# Patient Record
Sex: Male | Born: 1990 | Race: White | Hispanic: No | Marital: Single | Smoking: Current every day smoker
Health system: Southern US, Community
[De-identification: ages and names within clinical notes are randomized; demographics above are authoritative.]

## PROBLEM LIST (undated history)

## (undated) DIAGNOSIS — F909 Attention-deficit hyperactivity disorder, unspecified type: Secondary | ICD-10-CM

## (undated) HISTORY — PX: TONSILLECTOMY: SUR1361

---

## 2015-11-03 ENCOUNTER — Encounter (HOSPITAL_COMMUNITY): Payer: Self-pay | Admitting: Emergency Medicine

## 2015-11-03 ENCOUNTER — Ambulatory Visit (HOSPITAL_COMMUNITY)
Admission: EM | Admit: 2015-11-03 | Discharge: 2015-11-03 | Disposition: A | Discharge status: Home / Self Care | Payer: BLUE CROSS/BLUE SHIELD | Attending: Internal Medicine | Admitting: Internal Medicine

## 2015-11-03 DIAGNOSIS — J069 Acute upper respiratory infection, unspecified: Secondary | ICD-10-CM

## 2015-11-03 DIAGNOSIS — J9801 Acute bronchospasm: Secondary | ICD-10-CM | POA: Diagnosis not present

## 2015-11-03 MED ORDER — PREDNISONE 20 MG PO TABS: ORAL_TABLET | ORAL | 0 refills | Status: AC

## 2015-11-03 MED ORDER — ALBUTEROL SULFATE HFA 108 (90 BASE) MCG/ACT IN AERS: 2.0000 | INHALATION_SPRAY | RESPIRATORY_TRACT | 0 refills | Status: AC | PRN

## 2015-11-03 MED ORDER — IPRATROPIUM-ALBUTEROL 0.5-2.5 (3) MG/3ML IN SOLN
3.0000 mL | Freq: Once | RESPIRATORY_TRACT | Status: AC
  Administered 2015-11-03: 3 mL via RESPIRATORY_TRACT

## 2015-11-03 MED ORDER — METHYLPREDNISOLONE ACETATE 40 MG/ML IJ SUSP
INTRAMUSCULAR | Status: AC
  Filled 2015-11-03: qty 1

## 2015-11-03 MED ORDER — DEXAMETHASONE SODIUM PHOSPHATE 10 MG/ML IJ SOLN
10.0000 mg | Freq: Once | INTRAMUSCULAR | Status: AC
  Administered 2015-11-03: 10 mg via INTRAMUSCULAR

## 2015-11-03 MED ORDER — ALBUTEROL SULFATE (2.5 MG/3ML) 0.083% IN NEBU
2.5000 mg | INHALATION_SOLUTION | Freq: Once | RESPIRATORY_TRACT | Status: AC
  Administered 2015-11-03: 2.5 mg via RESPIRATORY_TRACT

## 2015-11-03 MED ORDER — IPRATROPIUM-ALBUTEROL 0.5-2.5 (3) MG/3ML IN SOLN
RESPIRATORY_TRACT | Status: AC
  Filled 2015-11-03: qty 3

## 2015-11-03 MED ORDER — METHYLPREDNISOLONE ACETATE 40 MG/ML IJ SUSP
40.0000 mg | Freq: Once | INTRAMUSCULAR | Status: AC
  Administered 2015-11-03: 40 mg via INTRAMUSCULAR

## 2015-11-03 MED ORDER — DEXAMETHASONE SODIUM PHOSPHATE 10 MG/ML IJ SOLN
INTRAMUSCULAR | Status: AC
  Filled 2015-11-03: qty 1

## 2015-11-03 MED ORDER — ALBUTEROL SULFATE (2.5 MG/3ML) 0.083% IN NEBU
INHALATION_SOLUTION | RESPIRATORY_TRACT | Status: AC
  Filled 2015-11-03: qty 3

## 2015-11-03 NOTE — Discharge Instructions (Signed)
For nasal and head congestion may take Sudafed PE 10 mg every 4 hours as needed. °Saline nasal spray used frequently. °For drainage may use Allegra, Claritin or Zyrtec. If you need stronger medicine to stop drainage may take Chlor-Trimeton 2-4 mg every 4 hours. This may cause drowsiness. °Ibuprofen 600 mg every 6 hours as needed for pain, discomfort or fever. °Drink plenty of fluids and stay well-hydrated. °

## 2015-11-03 NOTE — ED Provider Notes (Signed)
CSN: [id number]     Arrival date & time 11/03/15  1606 History   First MD Initiated Contact with Patient 11/03/15 1849     Chief Complaint  Patient presents with  . URI   (Consider location/radiation/quality/duration/timing/severity/associated sxs/prior Treatment) 25 year old male complaining of chest congestion for 5-6 days. And associated with cough sometimes producing mucus, PND, earache and sore throat. Denies fever. States he smokes one cigar a day. Last evening he took some NyQuil with minimal relief.      History reviewed. No pertinent past medical history. History reviewed. No pertinent surgical history. No family history on file. Social History  Substance Use Topics  . Smoking status: Current Every Day Smoker    Types: Cigars  . Smokeless tobacco: Never Used     Comment: 1 cigar per day  . Alcohol use Yes    Review of Systems  Constitutional: Positive for activity change. Negative for diaphoresis, fatigue and fever.  HENT: Positive for congestion, postnasal drip, rhinorrhea and sore throat. Negative for ear pain, facial swelling and trouble swallowing.   Eyes: Negative for pain, discharge and redness.  Respiratory: Positive for cough, shortness of breath and wheezing. Negative for chest tightness.   Cardiovascular: Negative.   Gastrointestinal: Negative.   Musculoskeletal: Negative.  Negative for neck pain and neck stiffness.  Neurological: Negative.   All other systems reviewed and are negative.   Allergies  Review of patient's allergies indicates no known allergies.  Home Medications   Prior to Admission medications   Medication Sig Start Date End Date Taking? Authorizing Provider  albuterol (PROVENTIL HFA;VENTOLIN HFA) 108 (90 Base) MCG/ACT inhaler Inhale 2 puffs into the lungs every 4 (four) hours as needed for wheezing or shortness of breath. 11/03/15   Hayden Rasmussenavid Vianey Caniglia, NP  predniSONE (DELTASONE) 20 MG tablet Take 3 tabs po on first day, 2 tabs second day, 2 tabs  third day, 1 tab fourth day, 1 tab 5th day. Take with food. 11/03/15   Hayden Rasmussenavid England Greb, NP   Meds Ordered and Administered this Visit   Medications  dexamethasone (DECADRON) injection 10 mg (10 mg Intramuscular Given 11/03/15 1925)  methylPREDNISolone acetate (DEPO-MEDROL) injection 40 mg (40 mg Intramuscular Given 11/03/15 1925)  ipratropium-albuterol (DUONEB) 0.5-2.5 (3) MG/3ML nebulizer solution 3 mL (3 mLs Nebulization Given 11/03/15 1925)  albuterol (PROVENTIL) (2.5 MG/3ML) 0.083% nebulizer solution 2.5 mg (2.5 mg Nebulization Given 11/03/15 1925)    BP 126/71 (BP Location: Left Arm)   Pulse 68   Temp 98.7 F (37.1 C) (Oral)   Resp 16   Ht 5\' 8"  (1.727 m)   Wt 145 lb (65.8 kg)   SpO2 100%   BMI 22.05 kg/m  No data found.   Physical Exam  Constitutional: He is oriented to person, place, and time. He appears well-developed and well-nourished. No distress.  HENT:  Head: Normocephalic and atraumatic.  Right Ear: External ear normal.  Left Ear: External ear normal.  Mouth/Throat: No oropharyngeal exudate.  Bilateral TMs are retracted. Right greater than left. Right TM with minor erythema.  Oropharynx with minor erythema and cobblestoning.  Eyes: EOM are normal. Pupils are equal, round, and reactive to light.  Neck: Normal range of motion. Neck supple.  Cardiovascular: Normal rate, regular rhythm and normal heart sounds.   Pulmonary/Chest: No respiratory distress.  Loud coarse wheezing and rhonchi bilaterally. Prolonged expiratory phase.  Musculoskeletal: Normal range of motion. He exhibits no edema.  Lymphadenopathy:    He has no cervical adenopathy.  Neurological: He is alert  and oriented to person, place, and time.  Skin: Skin is warm and dry. No rash noted.  Psychiatric: He has a normal mood and affect.  Nursing note and vitals reviewed.   Urgent Care Course   Clinical Course    Procedures (including critical care time)  Labs Review Labs Reviewed - No data to  display  Imaging Review No results found.   Visual Acuity Review  Right Eye Distance:   Left Eye Distance:   Bilateral Distance:    Right Eye Near:   Left Eye Near:    Bilateral Near:         MDM   1. URI (upper respiratory infection)   2. Bronchospasm   After the DuoNeb patient states he felt better and was breathing better. Auscultation reveals near complete absence of coarseness and rhonchi. Distant faint distant rare wheeze. Good air movement and normal inspiratory to 4 days his. For nasal and head congestion may take Sudafed PE 10 mg every 4 hours as needed. Saline nasal spray used frequently. For drainage may use Allegra, Claritin or Zyrtec. If you need stronger medicine to stop drainage may take Chlor-Trimeton 2-4 mg every 4 hours. This may cause drowsiness. Ibuprofen 600 mg every 6 hours as needed for pain, discomfort or fever. Drink plenty of fluids and stay well-hydrated. Meds ordered this encounter  Medications  . dexamethasone (DECADRON) injection 10 mg  . methylPREDNISolone acetate (DEPO-MEDROL) injection 40 mg  . ipratropium-albuterol (DUONEB) 0.5-2.5 (3) MG/3ML nebulizer solution 3 mL  . albuterol (PROVENTIL) (2.5 MG/3ML) 0.083% nebulizer solution 2.5 mg  . albuterol (PROVENTIL HFA;VENTOLIN HFA) 108 (90 Base) MCG/ACT inhaler    Sig: Inhale 2 puffs into the lungs every 4 (four) hours as needed for wheezing or shortness of breath.    Dispense:  1 Inhaler    Refill:  0    Order Specific Question:   Supervising Provider    Answer:   Eustace Moore [[id number]]  . predniSONE (DELTASONE) 20 MG tablet    Sig: Take 3 tabs po on first day, 2 tabs second day, 2 tabs third day, 1 tab fourth day, 1 tab 5th day. Take with food.    Dispense:  9 tablet    Refill:  0    Order Specific Question:   Supervising Provider    Answer:   Eustace Moore [[id number]]    Verbal and written discharge instructions have been reviewed and given to the patient  by the provider to include  medications and other care measures.    Hayden Rasmussen, NP 11/03/15 1954    Hayden Rasmussen, NP 11/03/15 [phone]

## 2015-11-03 NOTE — ED Notes (Signed)
PT discharged by Hayden Rasmussenavid Mabe at [phone]

## 2015-11-03 NOTE — ED Triage Notes (Signed)
PT reports cough and chest congestion for 1 week. PT reports productive cough with yellow sputum. PT reports he is feeling fatigued. PT also reports he has not been able to sleep due to constant coughing.

## 2017-03-11 ENCOUNTER — Encounter (HOSPITAL_COMMUNITY): Payer: Self-pay | Admitting: Emergency Medicine

## 2017-03-11 ENCOUNTER — Emergency Department (HOSPITAL_COMMUNITY)
Admission: EM | Admit: 2017-03-11 | Discharge: 2017-03-12 | Disposition: A | Discharge status: Home / Self Care | Payer: BLUE CROSS/BLUE SHIELD | Attending: Emergency Medicine | Admitting: Emergency Medicine

## 2017-03-11 DIAGNOSIS — R1010 Upper abdominal pain, unspecified: Secondary | ICD-10-CM | POA: Diagnosis present

## 2017-03-11 DIAGNOSIS — F17290 Nicotine dependence, other tobacco product, uncomplicated: Secondary | ICD-10-CM | POA: Insufficient documentation

## 2017-03-11 DIAGNOSIS — R110 Nausea: Secondary | ICD-10-CM | POA: Insufficient documentation

## 2017-03-11 DIAGNOSIS — R1011 Right upper quadrant pain: Secondary | ICD-10-CM | POA: Insufficient documentation

## 2017-03-11 DIAGNOSIS — K2900 Acute gastritis without bleeding: Secondary | ICD-10-CM | POA: Insufficient documentation

## 2017-03-11 DIAGNOSIS — F1910 Other psychoactive substance abuse, uncomplicated: Secondary | ICD-10-CM | POA: Diagnosis not present

## 2017-03-11 HISTORY — DX: Attention-deficit hyperactivity disorder, unspecified type: F90.9

## 2017-03-11 LAB — COMPREHENSIVE METABOLIC PANEL
ALBUMIN: 4.4 g/dL (ref 3.5–5.0)
ALT: 18 U/L (ref 17–63)
ANION GAP: 13 (ref 5–15)
AST: 20 U/L (ref 15–41)
Alkaline Phosphatase: 50 U/L (ref 38–126)
BUN: 11 mg/dL (ref 6–20)
CO2: 22 mmol/L (ref 22–32)
Calcium: 9.3 mg/dL (ref 8.9–10.3)
Chloride: 104 mmol/L (ref 101–111)
Creatinine, Ser: 1.06 mg/dL (ref 0.61–1.24)
Glucose, Bld: 88 mg/dL (ref 65–99)
POTASSIUM: 3.8 mmol/L (ref 3.5–5.1)
Sodium: 139 mmol/L (ref 135–145)
TOTAL PROTEIN: 6.8 g/dL (ref 6.5–8.1)
Total Bilirubin: 1.5 mg/dL — ABNORMAL HIGH (ref 0.3–1.2)

## 2017-03-11 LAB — URINALYSIS, ROUTINE W REFLEX MICROSCOPIC
Bilirubin Urine: NEGATIVE
Glucose, UA: NEGATIVE mg/dL
Hgb urine dipstick: NEGATIVE
Ketones, ur: NEGATIVE mg/dL
LEUKOCYTES UA: NEGATIVE
NITRITE: NEGATIVE
Protein, ur: NEGATIVE mg/dL
SPECIFIC GRAVITY, URINE: 1.017 (ref 1.005–1.030)
pH: 6 (ref 5.0–8.0)

## 2017-03-11 LAB — CBC
HEMATOCRIT: 40.9 % (ref 39.0–52.0)
HEMOGLOBIN: 14.9 g/dL (ref 13.0–17.0)
MCH: 31.6 pg (ref 26.0–34.0)
MCHC: 36.4 g/dL — ABNORMAL HIGH (ref 30.0–36.0)
MCV: 86.8 fL (ref 78.0–100.0)
Platelets: 172 10*3/uL (ref 150–400)
RBC: 4.71 MIL/uL (ref 4.22–5.81)
RDW: 11.6 % (ref 11.5–15.5)
WBC: 6.6 10*3/uL (ref 4.0–10.5)

## 2017-03-11 LAB — LIPASE, BLOOD: Lipase: 32 U/L (ref 11–51)

## 2017-03-11 MED ORDER — ONDANSETRON 4 MG PO TBDP
4.0000 mg | ORAL_TABLET | Freq: Once | ORAL | Status: AC | PRN
  Administered 2017-03-11: 4 mg via ORAL
  Filled 2017-03-11: qty 1

## 2017-03-11 MED ORDER — OXYCODONE-ACETAMINOPHEN 5-325 MG PO TABS
1.0000 | ORAL_TABLET | ORAL | Status: DC | PRN
  Administered 2017-03-11: 1 via ORAL
  Filled 2017-03-11 (×2): qty 1

## 2017-03-11 NOTE — ED Triage Notes (Signed)
Pt reports upper abd pain x4-5 days. States he went to [facility] today and didn't like the wait time so he came here. Reports some nausea. States worse after eating fast food, also reporting that he got a rib tattoo today and this hurts worse than that.

## 2017-03-11 NOTE — ED Notes (Signed)
Family member up to nurse first desk multiple times stating that pt is hurting and wants to go home and die. Explained to family member that rooms are being cleaned and pt will be pulled to treatment room shortly. Verbalized understanding

## 2017-03-11 NOTE — ED Notes (Signed)
Family member stopped this RN in lobby, stating the patient is hurting and that "we were sent here as an emergency and when his gallblabber ruptures I'm suing this damn hospital." Explained to pt and family member the delay and offered pain medication. Family member being verbally abusive during the entire encounter, continued to call RN "ignorant and neglectful" that we weren't getting the pt seen faster; also requesting to speak to an Production designer, theatre/television/filmadministrator; charge RN made aware. Pt medicated per standing orders and placed back in lobby

## 2017-03-12 ENCOUNTER — Emergency Department (HOSPITAL_COMMUNITY): Payer: BLUE CROSS/BLUE SHIELD

## 2017-03-12 ENCOUNTER — Other Ambulatory Visit: Payer: Self-pay

## 2017-03-12 LAB — RAPID URINE DRUG SCREEN, HOSP PERFORMED
AMPHETAMINES: POSITIVE — AB
BARBITURATES: NOT DETECTED
Benzodiazepines: POSITIVE — AB
Cocaine: POSITIVE — AB
OPIATES: NOT DETECTED
TETRAHYDROCANNABINOL: POSITIVE — AB

## 2017-03-12 LAB — I-STAT TROPONIN, ED: TROPONIN I, POC: 0 ng/mL (ref 0.00–0.08)

## 2017-03-12 LAB — ETHANOL

## 2017-03-12 LAB — TROPONIN I

## 2017-03-12 LAB — D-DIMER, QUANTITATIVE: D-Dimer, Quant: <0.27 ug/mL-FEU (ref 0.00–0.50)

## 2017-03-12 MED ORDER — SODIUM CHLORIDE 0.9 % IV BOLUS (SEPSIS)
1000.0000 mL | Freq: Once | INTRAVENOUS | Status: AC
  Administered 2017-03-12: 1000 mL via INTRAVENOUS

## 2017-03-12 MED ORDER — IOPAMIDOL (ISOVUE-300) INJECTION 61%
INTRAVENOUS | Status: AC
  Administered 2017-03-12: 100 mL
  Filled 2017-03-12: qty 100

## 2017-03-12 MED ORDER — ONDANSETRON HCL 4 MG/2ML IJ SOLN
4.0000 mg | Freq: Once | INTRAMUSCULAR | Status: AC
  Administered 2017-03-12: 4 mg via INTRAVENOUS
  Filled 2017-03-12: qty 2

## 2017-03-12 MED ORDER — SUCRALFATE 1 G PO TABS: 1.0000 g | ORAL_TABLET | Freq: Three times a day (TID) | ORAL | 0 refills | Status: AC

## 2017-03-12 MED ORDER — OMEPRAZOLE 20 MG PO CPDR: 20.0000 mg | DELAYED_RELEASE_CAPSULE | Freq: Every day | ORAL | 0 refills | Status: AC

## 2017-03-12 MED ORDER — PANTOPRAZOLE SODIUM 40 MG IV SOLR
40.0000 mg | Freq: Once | INTRAVENOUS | Status: AC
  Administered 2017-03-12: 40 mg via INTRAVENOUS
  Filled 2017-03-12: qty 40

## 2017-03-12 MED ORDER — FAMOTIDINE IN NACL 20-0.9 MG/50ML-% IV SOLN
20.0000 mg | Freq: Once | INTRAVENOUS | Status: AC
  Administered 2017-03-12: 20 mg via INTRAVENOUS
  Filled 2017-03-12: qty 50

## 2017-03-12 MED ORDER — GI COCKTAIL ~~LOC~~
30.0000 mL | Freq: Once | ORAL | Status: AC
  Administered 2017-03-12: 30 mL via ORAL
  Filled 2017-03-12: qty 30

## 2017-03-12 NOTE — ED Notes (Signed)
PO challenge started

## 2017-03-12 NOTE — Discharge Instructions (Signed)
Your gallbladder is normal.  Take the stomach medication as prescribed.  Avoid alcohol, spicy foods, NSAID medications, caffeine.  Avoid any marijuana or cocaine use.  Follow-up with a primary care physician and the GI doctor.  Return to the ED if you develop new or worsening symptoms.

## 2017-03-12 NOTE — ED Provider Notes (Signed)
[facility] EMERGENCY DEPARTMENT Provider Note   CSN: [id number] Arrival date & time: 03/11/17  2207     History   Chief Complaint Chief Complaint  Patient presents with  . Abdominal Pain    HPI Jonathan Dudley is a 27 y.o. male.  Patient presents with upper abdominal pain that has been intermittent over the past 5 days.  States he has left-sided upper abdominal pain when he wakes up every morning which gradually improves throughout the day.  Over the last 2 days he has had upper abdominal pain more in the center of his abdomen and on the right side.  This is associated with nausea but no vomiting.  The pain is worse when he tries to eat.  Patient also states he got a tattoo to his left ribs today but the pain in his abdomen is much worse than that.  The pain is worse when he lies down worse with palpation and worse with eating.  Denies any history of alcohol abuse.  Denies any history of acid reflux or ulcers.  He went to urgent care and was told that his "abdomen was inflamed" and  was sent to the ED.  He and his girlfriend stopped for fast food and had sex prior to coming to the ED. Patient denies any medical problems and denies any previous surgeries.  He denies any chest pain or shortness of breath.  Denies any illicit drug use or alcohol use. Does admit to marijuana use.  He is prescribed Klonopin and vyvanse. .  Patient's girlfriend was escorted out by security because she was being belligerent and aggressive.    Abdominal Pain   Associated symptoms include nausea. Pertinent negatives include fever, vomiting, dysuria, hematuria, headaches and myalgias.    Past Medical History:  Diagnosis Date  . ADHD     There are no active problems to display for this patient.   Past Surgical History:  Procedure Laterality Date  . TONSILLECTOMY         Home Medications    Prior to Admission medications   Medication Sig Start Date End Date Taking? Authorizing  Provider  albuterol (PROVENTIL HFA;VENTOLIN HFA) 108 (90 Base) MCG/ACT inhaler Inhale 2 puffs into the lungs every 4 (four) hours as needed for wheezing or shortness of breath. 11/03/15   Hayden Rasmussen, NP  predniSONE (DELTASONE) 20 MG tablet Take 3 tabs po on first day, 2 tabs second day, 2 tabs third day, 1 tab fourth day, 1 tab 5th day. Take with food. 11/03/15   Hayden Rasmussen, NP    Family History No family history on file.  Social History Social History   Tobacco Use  . Smoking status: Current Every Day Smoker    Types: Cigars, E-cigarettes  . Smokeless tobacco: Never Used  . Tobacco comment: 1 cigar per day  Substance Use Topics  . Alcohol use: Yes  . Drug use: No     Allergies   Patient has no known allergies.   Review of Systems Review of Systems  Constitutional: Positive for activity change and appetite change. Negative for fatigue and fever.  HENT: Negative for congestion and rhinorrhea.   Eyes: Negative for visual disturbance.  Respiratory: Negative for cough, chest tightness and shortness of breath.   Cardiovascular: Negative for chest pain.  Gastrointestinal: Positive for abdominal pain and nausea. Negative for blood in stool and vomiting.  Genitourinary: Negative for dysuria, hematuria, testicular pain and urgency.  Musculoskeletal: Negative for back pain and myalgias.  Skin: Negative for rash.  Neurological: Negative for dizziness, weakness and headaches.   all other systems are negative except as noted in the HPI and PMH.     Physical Exam Updated Vital Signs BP 121/77   Pulse (!) 57   Temp 98.3 F (36.8 C) (Oral)   Resp 18   Ht 5\' 7"  (1.702 m)   Wt 70.3 kg (155 lb)   SpO2 100%   BMI 24.28 kg/m   Physical Exam  Constitutional: He is oriented to person, place, and time. He appears well-developed and well-nourished. No distress.  Appears to be under the influence of some sedating substance  HENT:  Head: Normocephalic and atraumatic.  Mouth/Throat:  Oropharynx is clear and moist. No oropharyngeal exudate.  Eyes: Conjunctivae and EOM are normal. Pupils are equal, round, and reactive to light.  Neck: Normal range of motion. Neck supple.  No meningismus.  Cardiovascular: Normal rate, regular rhythm, normal heart sounds and intact distal pulses.  No murmur heard. Pulmonary/Chest: Effort normal and breath sounds normal. No respiratory distress. He exhibits no tenderness.  New tattoo to left ribs that is nontender and not erythematous  Abdominal: Soft. There is tenderness. There is guarding. There is no rebound.  Tenderness to the right upper quadrant and epigastrium with voluntary guarding  Genitourinary:  Genitourinary Comments: No testicular pain  Musculoskeletal: Normal range of motion. He exhibits no edema or tenderness.  Neurological: He is alert and oriented to person, place, and time. No cranial nerve deficit. He exhibits normal muscle tone. Coordination normal.  No ataxia on finger to nose bilaterally. No pronator drift. 5/5 strength throughout. CN 2-12 intact.Equal grip strength. Sensation intact.   Skin: Skin is warm.  Psychiatric: He has a normal mood and affect. His behavior is normal.  Nursing note and vitals reviewed.    ED Treatments / Results  Labs (all labs ordered are listed, but only abnormal results are displayed) Labs Reviewed  COMPREHENSIVE METABOLIC PANEL - Abnormal; Notable for the following components:      Result Value   Total Bilirubin 1.5 (*)    All other components within normal limits  CBC - Abnormal; Notable for the following components:   MCHC 36.4 (*)    All other components within normal limits  RAPID URINE DRUG SCREEN, HOSP PERFORMED - Abnormal; Notable for the following components:   Cocaine POSITIVE (*)    Benzodiazepines POSITIVE (*)    Amphetamines POSITIVE (*)    Tetrahydrocannabinol POSITIVE (*)    All other components within normal limits  LIPASE, BLOOD  URINALYSIS, ROUTINE W REFLEX  MICROSCOPIC  D-DIMER, QUANTITATIVE (NOT AT [facility])  TROPONIN I  ETHANOL  I-STAT TROPONIN, ED    EKG  EKG Interpretation  Date/Time:  Friday March 12 2017 01:46:44 EST Ventricular Rate:  59 PR Interval:    QRS Duration: 94 QT Interval:  416 QTC Calculation: 413 R Axis:   80 Text Interpretation:  Sinus rhythm Atrial premature complex ST elev, probable normal early repol pattern Baseline wander in lead(s) V3 No previous ECGs available Confirmed by Glynn Octave [phone]) on 03/12/2017 1:56:50 AM       Radiology Dg Chest 2 View  Result Date: 03/12/2017 CLINICAL DATA:  27 y/o  M; 4-5 days of upper abdominal pain. EXAM: CHEST  2 VIEW COMPARISON:  None. FINDINGS: The heart size and mediastinal contours are within normal limits. Both lungs are clear. The visualized skeletal structures are unremarkable. IMPRESSION: No active cardiopulmonary disease. Electronically Signed   By:  Mitzi Hansen M.D.   On: 03/12/2017 01:29   Ct Abdomen Pelvis W Contrast  Result Date: 03/12/2017 CLINICAL DATA:  Upper abdominal pain for 5 days.  Nausea. EXAM: CT ABDOMEN AND PELVIS WITH CONTRAST TECHNIQUE: Multidetector CT imaging of the abdomen and pelvis was performed using the standard protocol following bolus administration of intravenous contrast. CONTRAST:  ISOVUE-300 IOPAMIDOL (ISOVUE-300) INJECTION 61% COMPARISON:  Ultrasound right upper quadrant 03/12/2017 FINDINGS: Lower chest: The lung bases are clear. Hepatobiliary: Diffuse fatty infiltration of the liver. No focal lesions identified. Gallbladder and bile ducts are unremarkable. Pancreas: Unremarkable. No pancreatic ductal dilatation or surrounding inflammatory changes. Spleen: Normal in size without focal abnormality. Adrenals/Urinary Tract: Adrenal glands are unremarkable. Kidneys are normal, without renal calculi, focal lesion, or hydronephrosis. Bladder is unremarkable. Stomach/Bowel: Stomach, small bowel, and colon are mostly decompressed.  Scattered stool in the colon. Decompression of bowel limits evaluation for wall thickening but no inflammatory infiltration is identified appendix is normal. Vascular/Lymphatic: No significant vascular findings are present. No enlarged abdominal or pelvic lymph nodes. Reproductive: Prostate is unremarkable. Other: No abdominal wall hernia or abnormality. No abdominopelvic ascites. Musculoskeletal: No acute or significant osseous findings. IMPRESSION: Diffuse fatty infiltration of the liver. No acute process demonstrated in the abdomen or pelvis. No evidence of bowel obstruction or inflammation. Electronically Signed   By: Burman Nieves M.D.   On: 03/12/2017 03:43   US Abdomen Limited Ruq  Result Date: 03/12/2017 CLINICAL DATA:  Right upper quadrant pain for 7 days. EXAM: ULTRASOUND ABDOMEN LIMITED RIGHT UPPER QUADRANT COMPARISON:  None. FINDINGS: Gallbladder: Gallbladder is contracted consistent with nonfasting state. No stones or wall thickening. Murphy's sign is negative. Common bile duct: Diameter: 3.5 mm, normal Liver: No focal lesion identified. Within normal limits in parenchymal echogenicity. Portal vein is patent on color Doppler imaging with normal direction of blood flow towards the liver. IMPRESSION: No evidence of cholelithiasis or cholecystitis. Contracted gallbladder consistent with nonfasting state. Electronically Signed   By: Burman Nieves M.D.   On: 03/12/2017 02:23    Procedures Procedures (including critical care time)  Medications Ordered in ED Medications  oxyCODONE-acetaminophen (PERCOCET/ROXICET) 5-325 MG per tablet 1 tablet (1 tablet Oral Given 03/11/17 2304)  sodium chloride 0.9 % bolus 1,000 mL (1,000 mLs Intravenous New Bag/Given 03/12/17 0128)  ondansetron (ZOFRAN-ODT) disintegrating tablet 4 mg (4 mg Oral Given 03/11/17 2303)  ondansetron (ZOFRAN) injection 4 mg (4 mg Intravenous Given 03/12/17 0128)  gi cocktail (Maalox,Lidocaine,Donnatal) (30 mLs Oral Given 03/12/17 0128)       Initial Impression / Assessment and Plan / ED Course  I have reviewed the triage vital signs and the nursing notes.  Pertinent labs & imaging results that were available during my care of the patient were reviewed by me and considered in my medical decision making (see chart for details).    Patient with upper abdominal pain for several days.  Urgent care was reportedly concerned for gallbladder pathology. Patient is in no distress.  Vitals are stable.  Abdomen has no peritoneal signs. He is afebrile.  LFTs and lipase are normal. Urinalysis is negative.  Drug screen is positive for multiple substances including cocaine.  He adamantly denies cocaine use but admits to marijuana use.  He is prescribed benzodiazepines as well as Vyvanse.  Positive  Gallbladder imaging shows no gallstones.  Chest x-ray is negative.  D-dimer is negative.  Low suspicion for PE or ACS.  Suspect upper abdominal pain due to gastritis and possible esophagitis.  Patient  denies alcohol use.  He admits to daily marijuana use but when confronted about the cocaine in his urine he states he does not know how that got there.  Patient is tolerating p.o.  Will start PPI and Carafate.  Advised patient to cease all drug use including cocaine and marijuana.  Troponins are negative x2.  Low suspicion for ACS or PE.  No emergent surgical pathology identified.  Advised patient to avoid alcohol, NSAIDs, caffeine, spicy foods.  Follow-up with PCP and gastroenterology.  Return precautions discussed.  Final Clinical Impressions(s) / ED Diagnoses   Final diagnoses:  RUQ pain  Upper abdominal pain  Other acute gastritis without hemorrhage  Polysubstance abuse [facility](HCC)    ED Discharge Orders    None       Glynn Octaveancour, Shaunika Italiano, MD 03/12/17 [phone]

## 2017-03-12 NOTE — ED Notes (Signed)
Pt placed in a gown and hooked up to the monitor with the BP cuff and pulse OX

## 2017-03-12 NOTE — ED Notes (Signed)
Pt screaming on the hallway and cursing out about the long wait and states "This F------ hospital is not doing nothing for him, we need a Dr. Now," family oriented that the EDP is on an Emergency now with another pt as soon as they stabilize this pt they will take care of him. Family then scream louder "He is a real Emergency and no one is doing nothing, this is a F----- hospital" Security called and family member taken to the main lobby.

## 2018-02-15 IMAGING — US US ABDOMEN LIMITED
1 series · 14 of 25 positions shown · non-contrast
Comparison: None.

CLINICAL DATA: Right upper quadrant pain for 7 days.

EXAM:
ULTRASOUND ABDOMEN LIMITED RIGHT UPPER QUADRANT

[Series 1: us abdomen limited · 0.19mm/px · 14 of 50 slices shown]
[im 1/50]
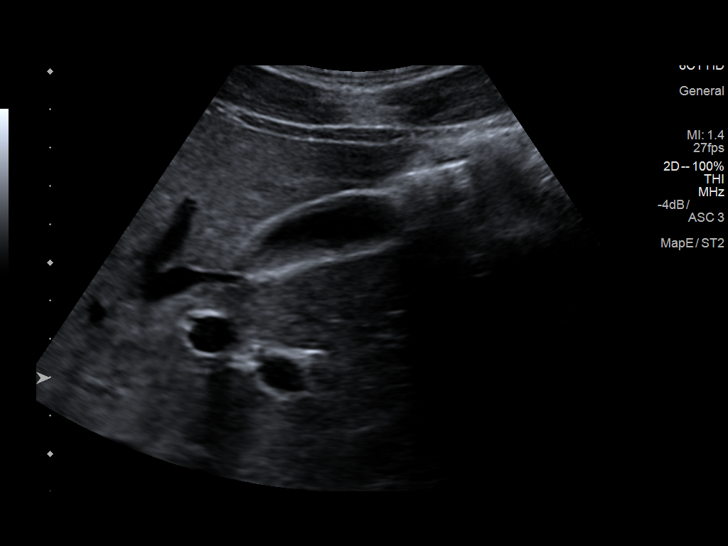
[im 5/50]
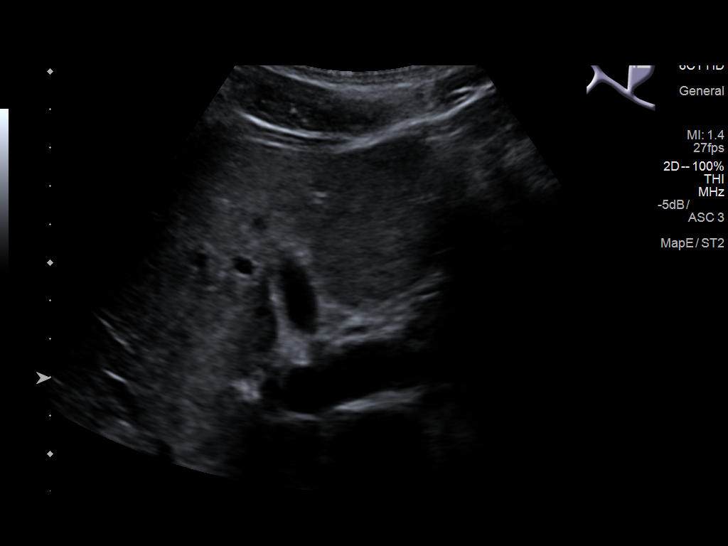
[im 9/50]
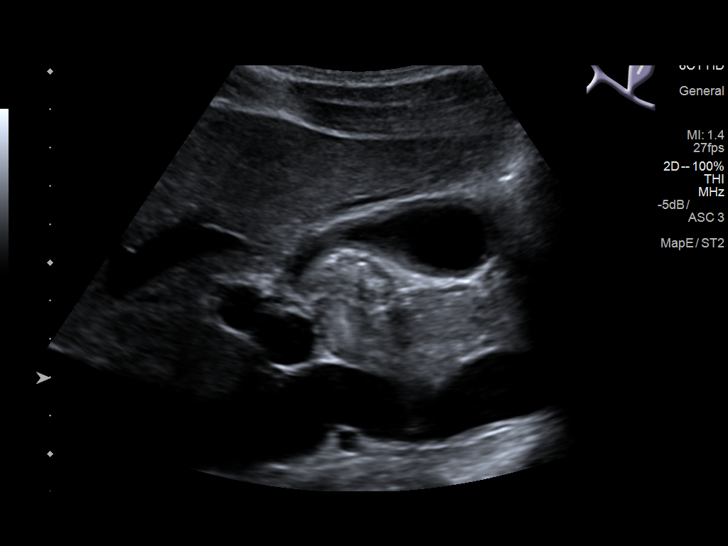
[im 13/50]
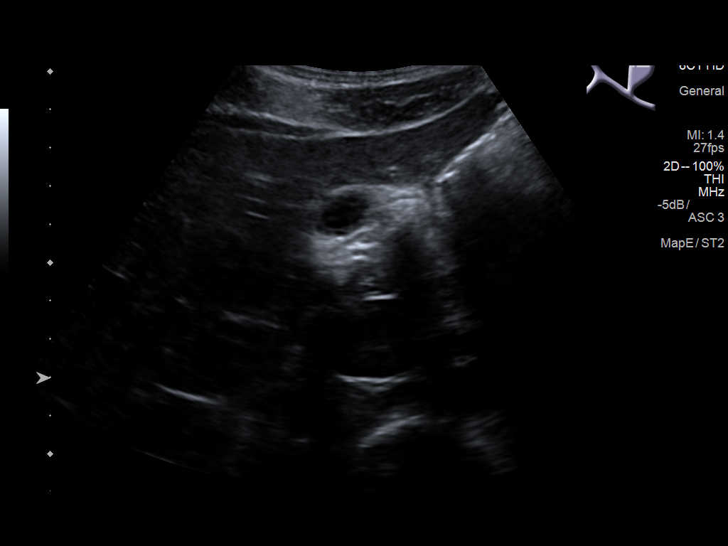
[im 17/50]
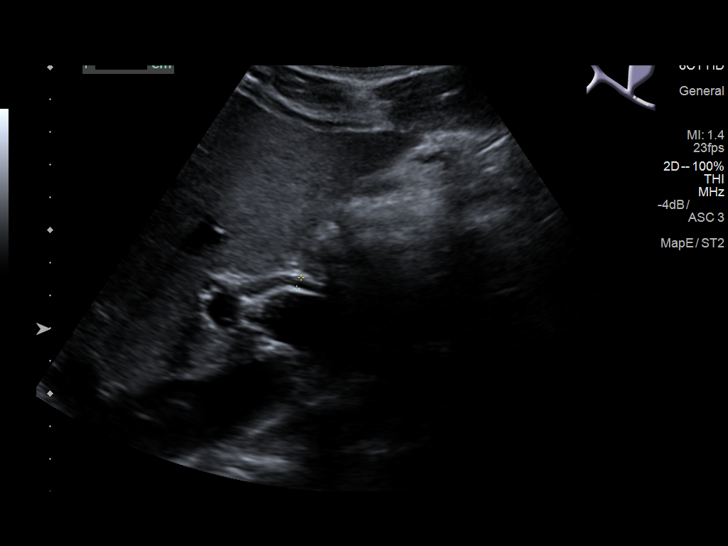
[im 19/50]
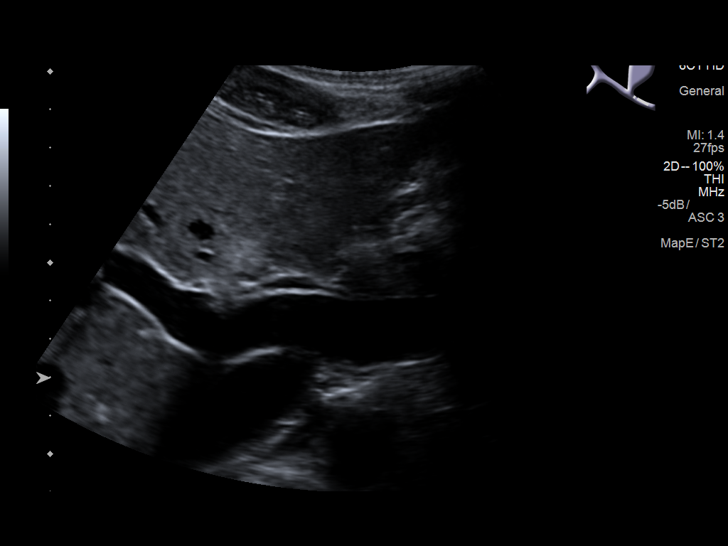
[im 23/50]
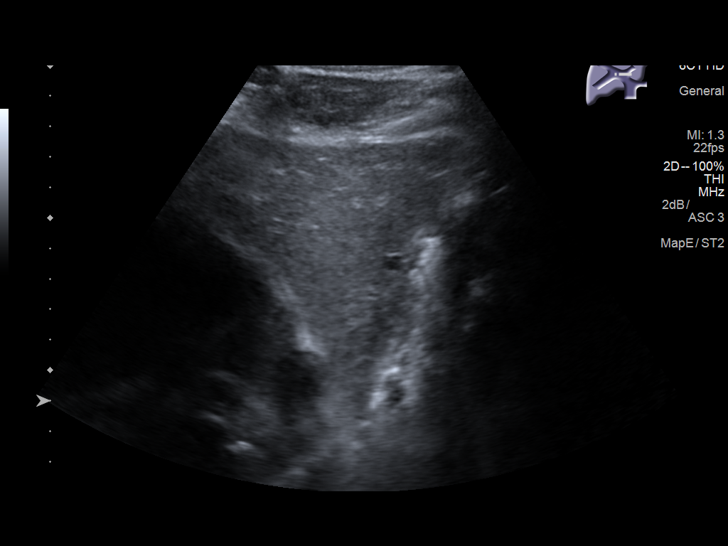
[im 27/50]
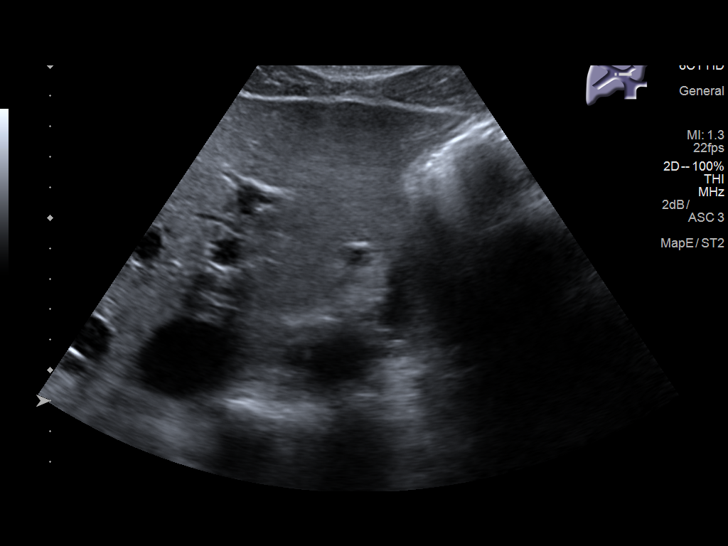
[im 31/50]
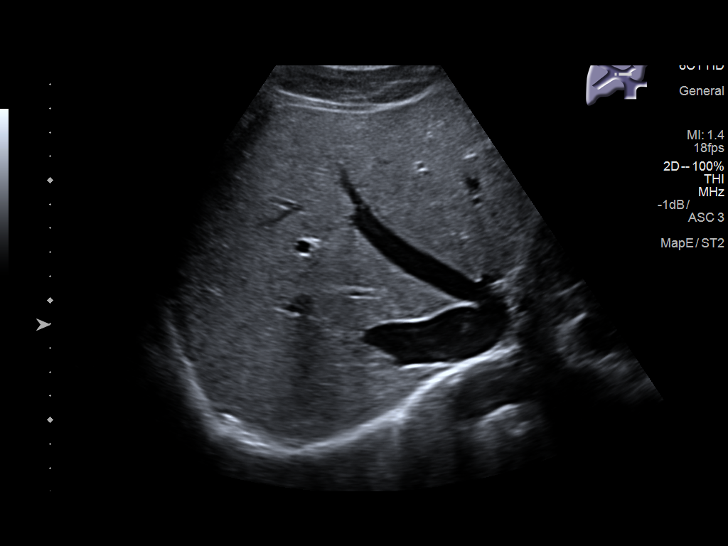
[im 33/50]
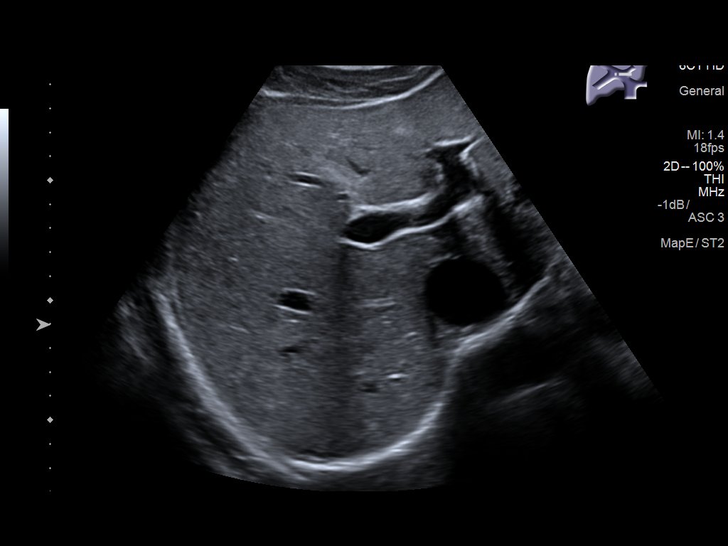
[im 37/50]
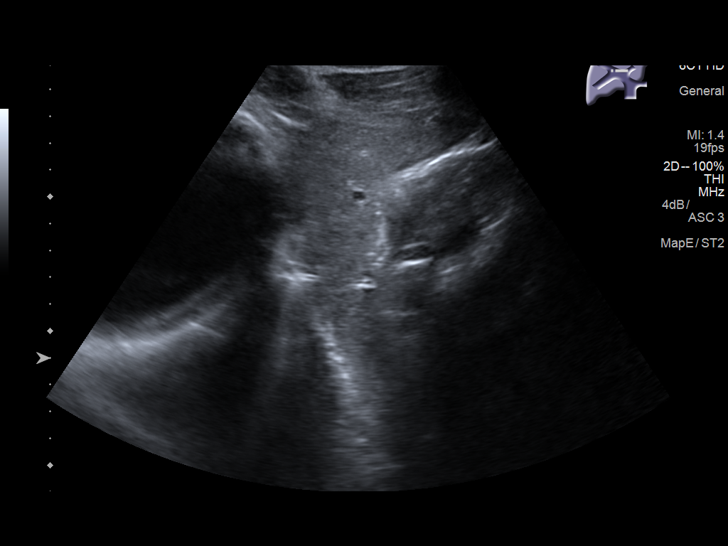
[im 41/50]
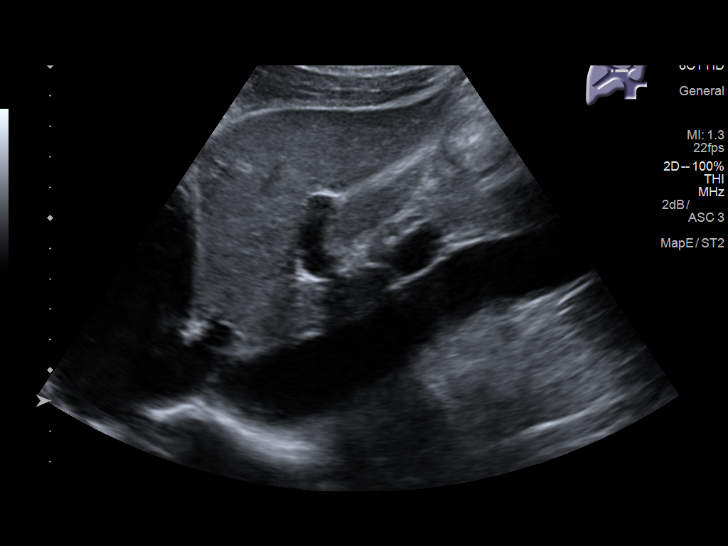
[im 45/50]
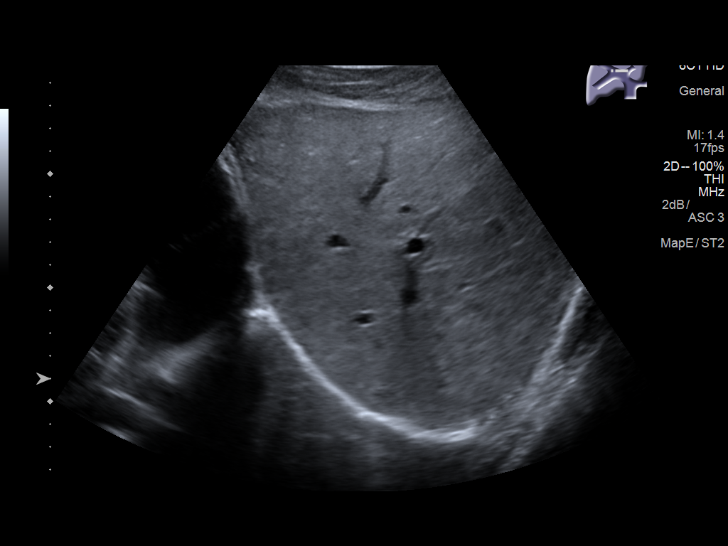
[im 50/50]
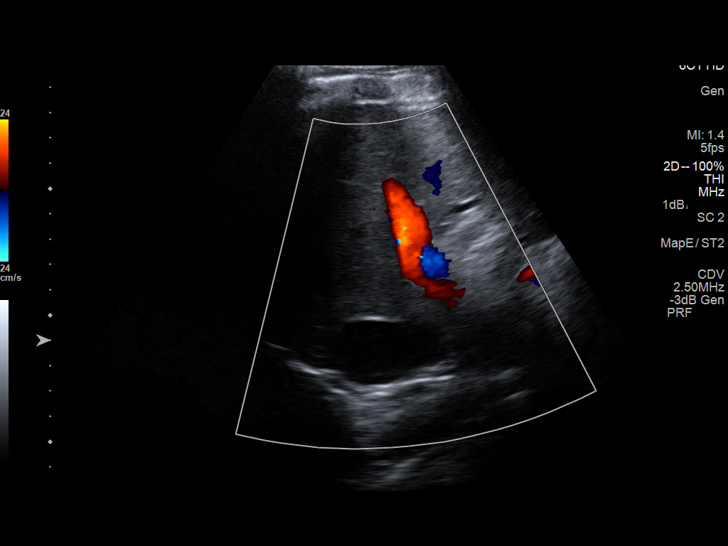

[14 of 25 positions shown; findings below may reference images not displayed]

FINDINGS: Gallbladder:

Gallbladder is contracted consistent with nonfasting state. No
stones or wall thickening. Murphy's sign is negative.

Common bile duct:

Diameter: 3.5 mm, normal

Liver:

No focal lesion identified. Within normal limits in parenchymal
echogenicity. Portal vein is patent on color Doppler imaging with
normal direction of blood flow towards the liver.
IMPRESSION: No evidence of cholelithiasis or cholecystitis. Contracted
gallbladder consistent with nonfasting state.

## 2019-05-13 IMAGING — CR DG CHEST 2V
2 series · 2 of 2 positions shown · non-contrast
Comparison: None.

CLINICAL DATA: 26 y/o  M; 4-5 days of upper abdominal pain.

EXAM:
CHEST  2 VIEW

[chest pa]
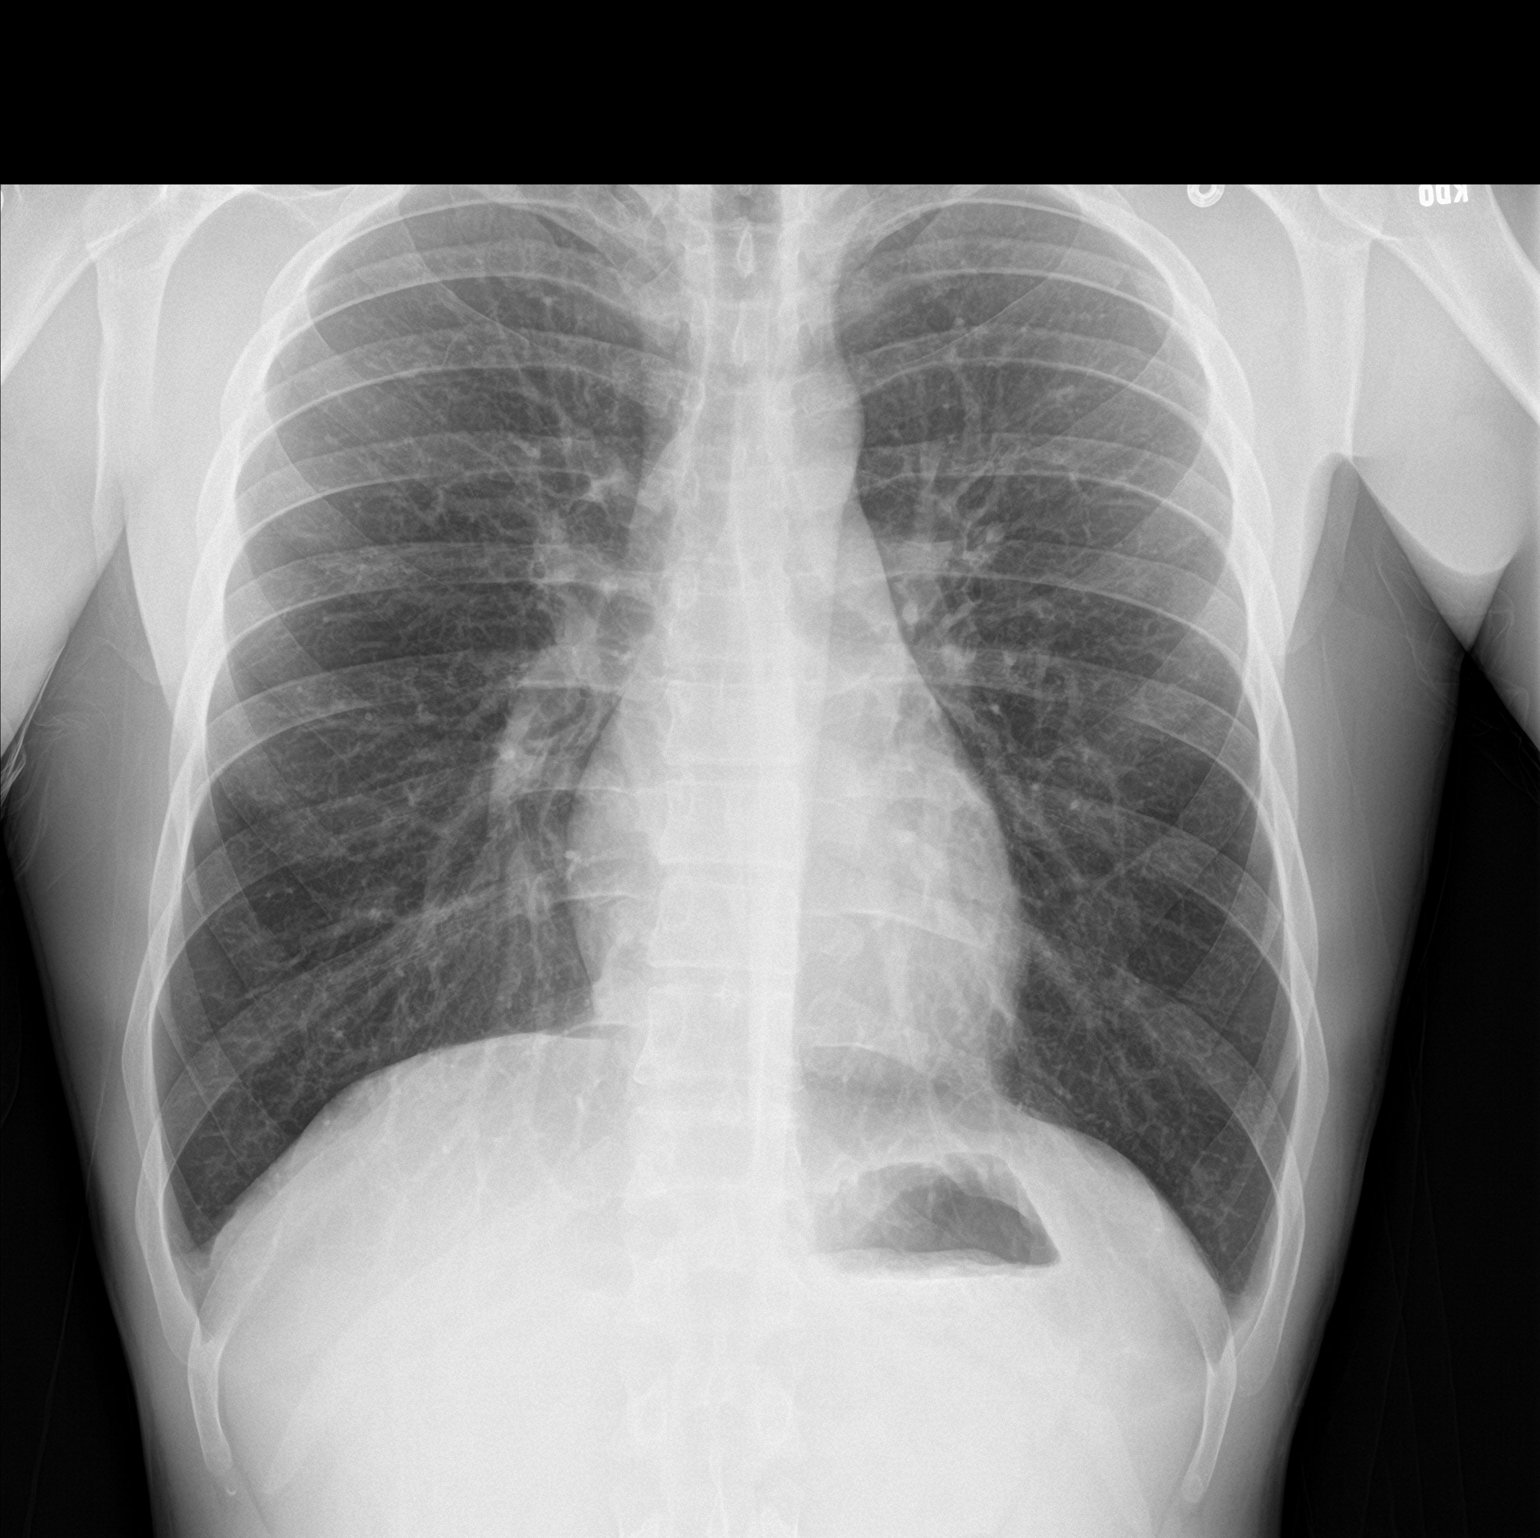

[chest lat]
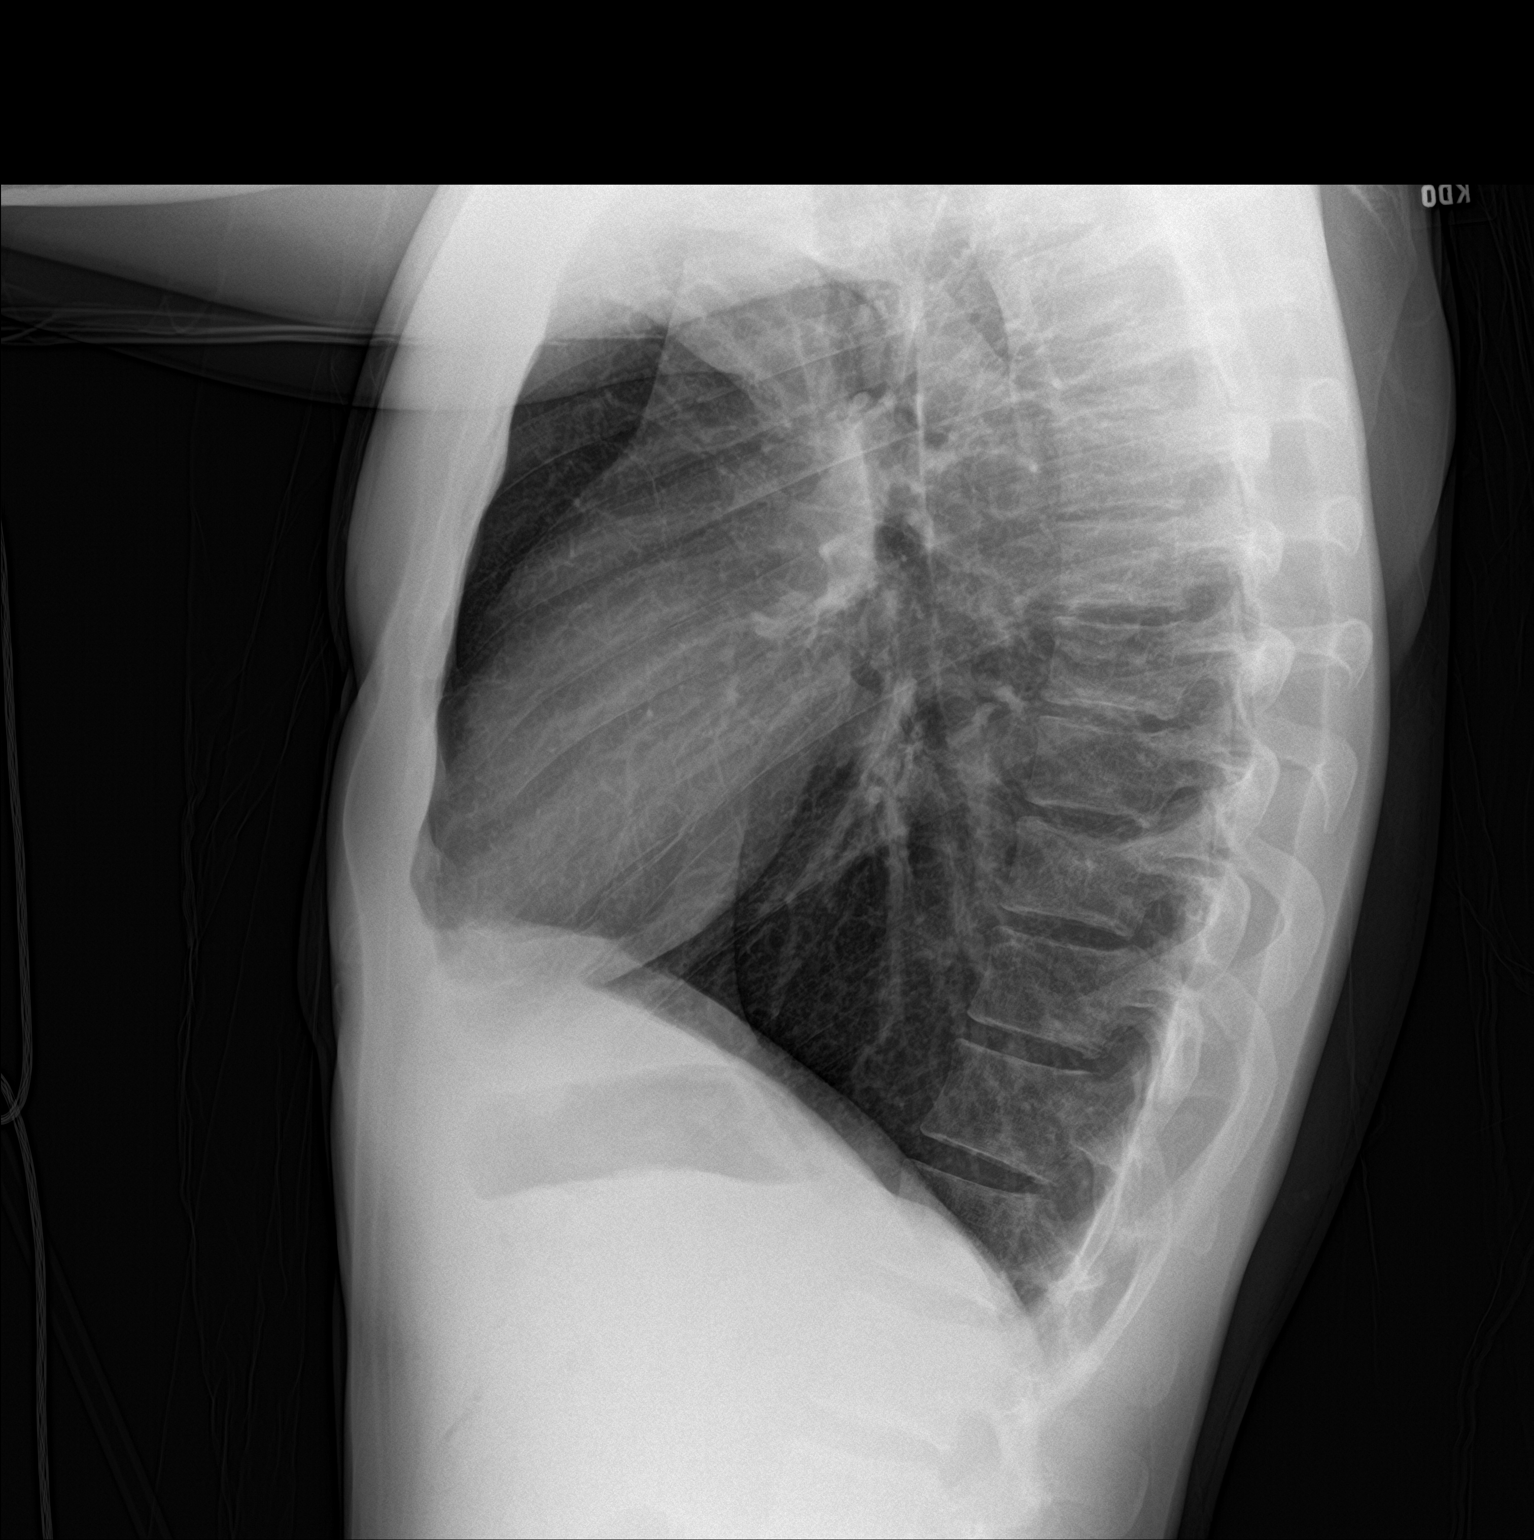

[2 of 2 positions shown; findings below may reference images not displayed]

FINDINGS: The heart size and mediastinal contours are within normal limits.
Both lungs are clear. The visualized skeletal structures are
unremarkable.
IMPRESSION: No active cardiopulmonary disease.

By: Mirthala Zubair M.D.
